# Patient Record
Sex: Male | Born: 1980 | Race: White | Hispanic: No | Marital: Single | Smoking: Current every day smoker
Health system: Southern US, Community
[De-identification: ages and names within clinical notes are randomized; demographics above are authoritative.]

---

## 1999-03-07 ENCOUNTER — Emergency Department (HOSPITAL_COMMUNITY): Admission: EM | Admit: 1999-03-07 | Discharge: 1999-03-07 | Payer: Self-pay | Admitting: *Deleted

## 2003-01-22 ENCOUNTER — Encounter: Payer: Self-pay | Admitting: Emergency Medicine

## 2003-01-22 ENCOUNTER — Emergency Department (HOSPITAL_COMMUNITY): Admission: EM | Admit: 2003-01-22 | Discharge: 2003-01-22 | Payer: Self-pay | Admitting: Emergency Medicine

## 2003-08-30 ENCOUNTER — Emergency Department (HOSPITAL_COMMUNITY): Admission: AD | Admit: 2003-08-30 | Discharge: 2003-08-30 | Payer: Self-pay | Admitting: Family Medicine

## 2003-12-21 ENCOUNTER — Emergency Department (HOSPITAL_COMMUNITY): Admission: EM | Admit: 2003-12-21 | Discharge: 2003-12-21 | Payer: Self-pay | Admitting: Emergency Medicine

## 2005-10-09 IMAGING — CT CT HEAD W/O CM
1 series · 16 of 28 positions shown, 20 images · non-contrast
Comparison: none

CLINICAL DATA: Syncope, left frontal soreness.  
 CT OF THE HEAD, WITHOUT CONTRAST ? 12/21/03
 No prior studies.
 TECHNIQUE 
 Contiguous axial CT images are obtained from the skull base to the vertex.  IV contrast was not requested.  
 Routine non-contrast head CT was performed.

[Series 2: brain · axial · 0.45mm/px · z∈[+118,+245]mm · 16 of 28 slices shown, 20 images]
[im 2/28  brain]
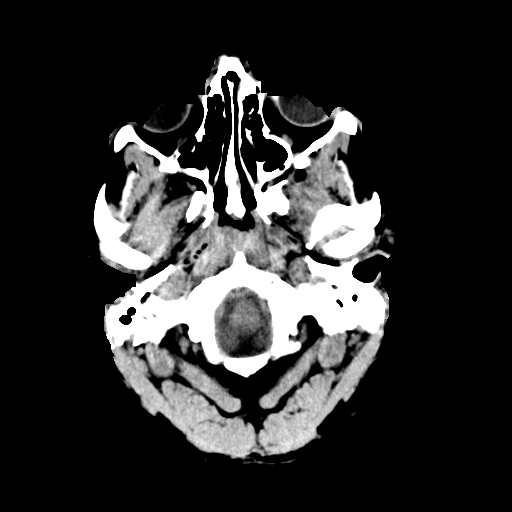
[im 2/28  bone]
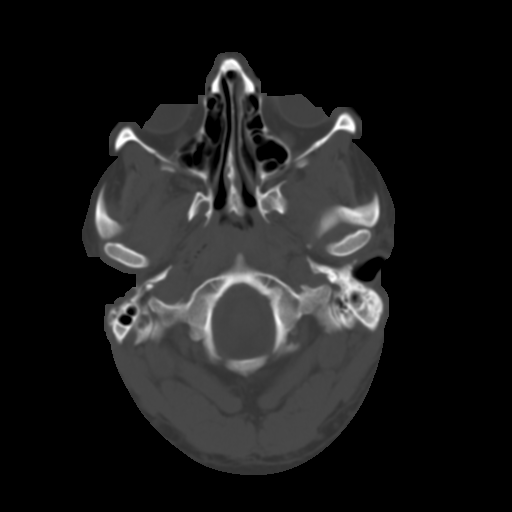
[im 4/28  brain]
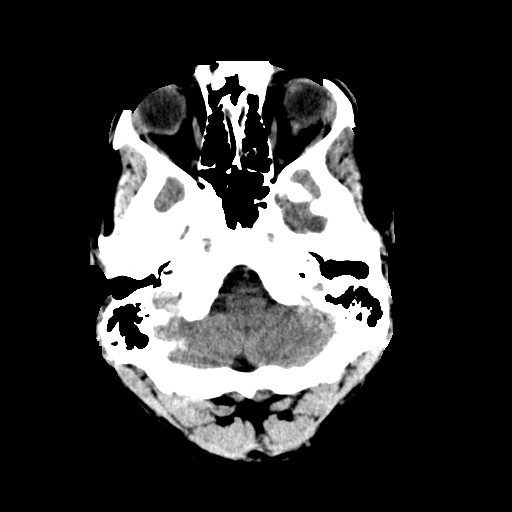
[im 6/28  brain]
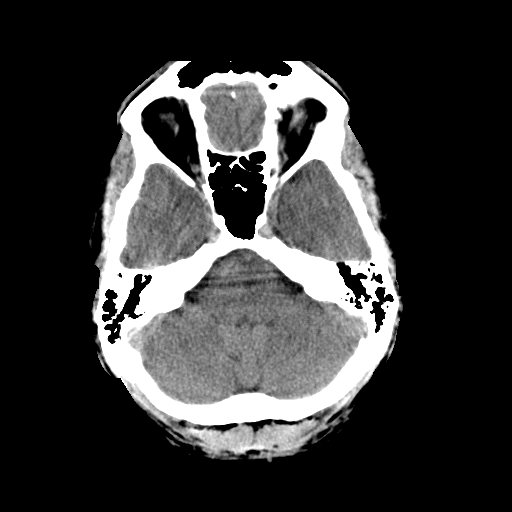
[im 7/28  brain]
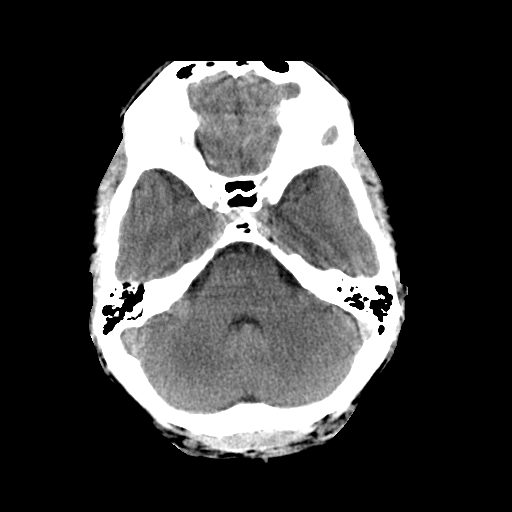
[im 9/28  brain]
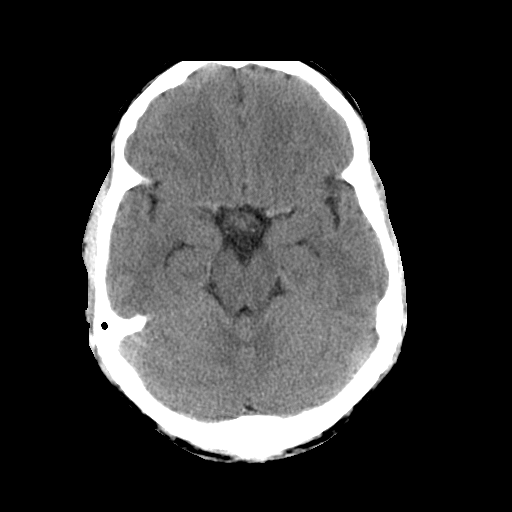
[im 9/28  bone]
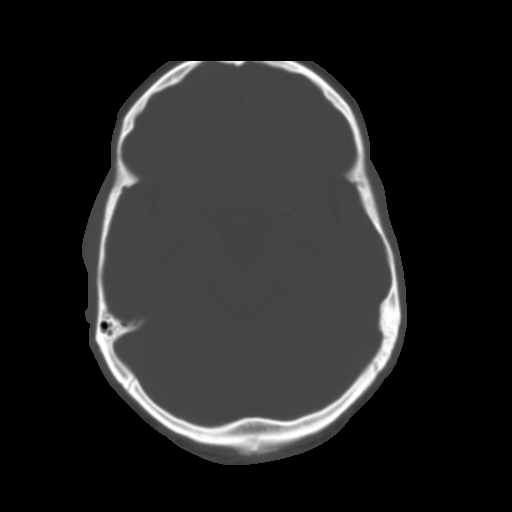
[im 10/28  brain]
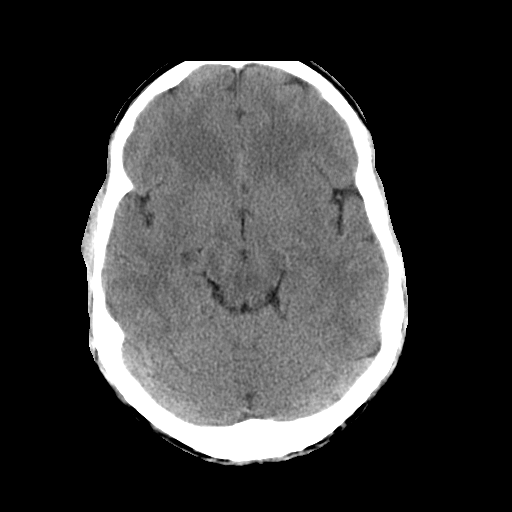
[im 12/28  brain]
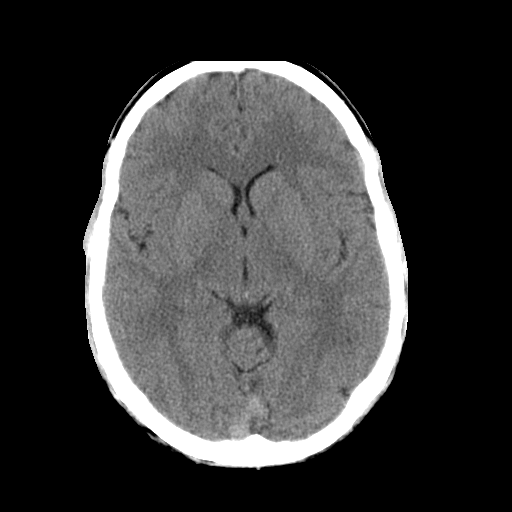
[im 14/28  brain]
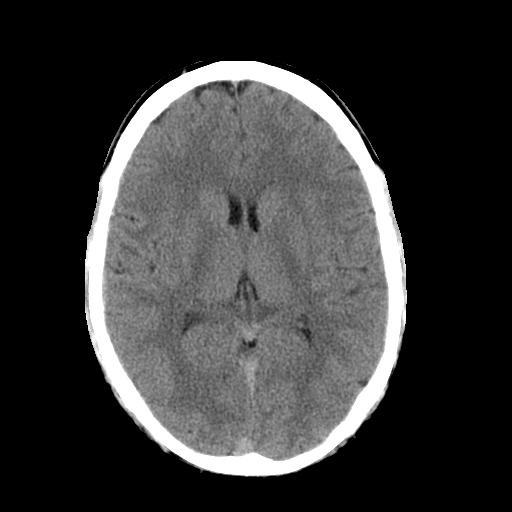
[im 15/28  brain]
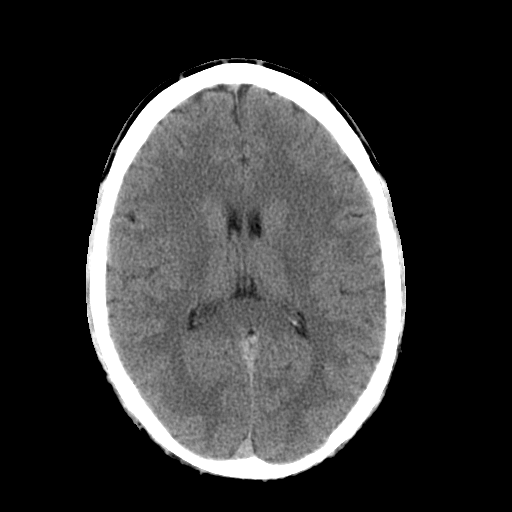
[im 15/28  bone]
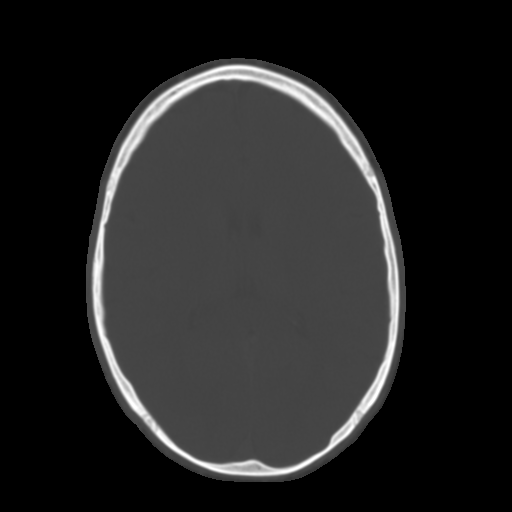
[im 17/28  brain]
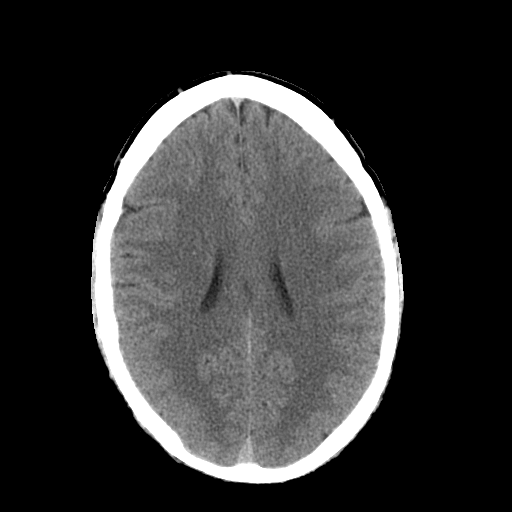
[im 19/28  brain]
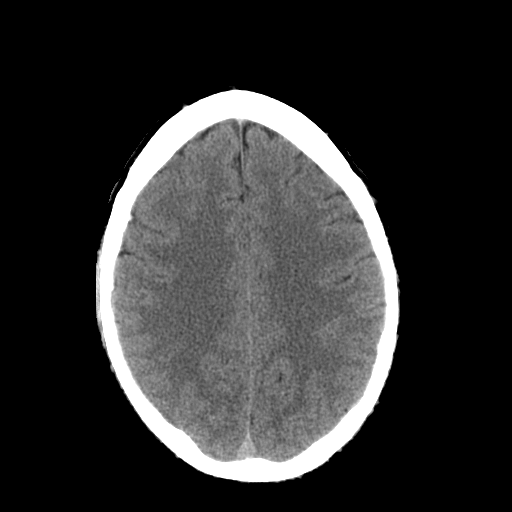
[im 20/28  brain]
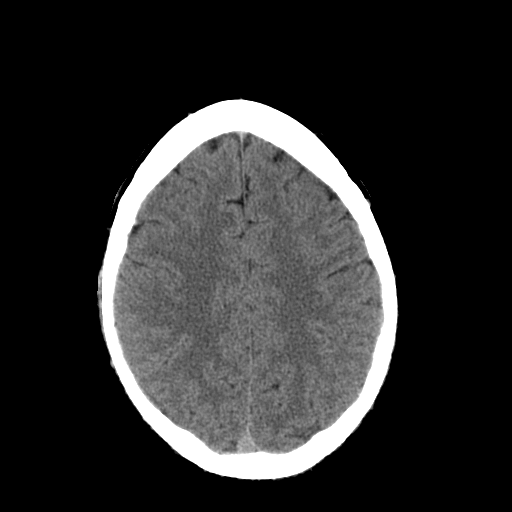
[im 22/28  brain]
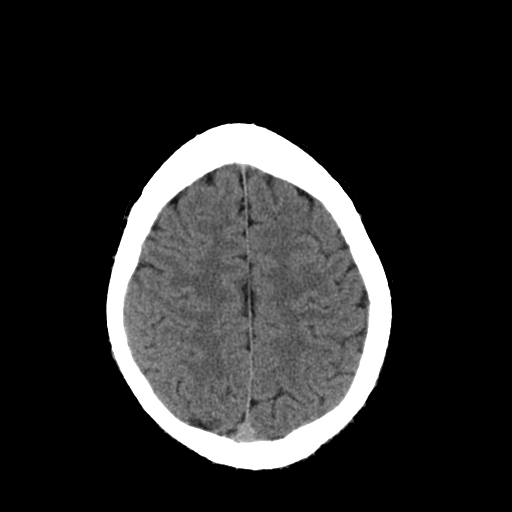
[im 22/28  bone]
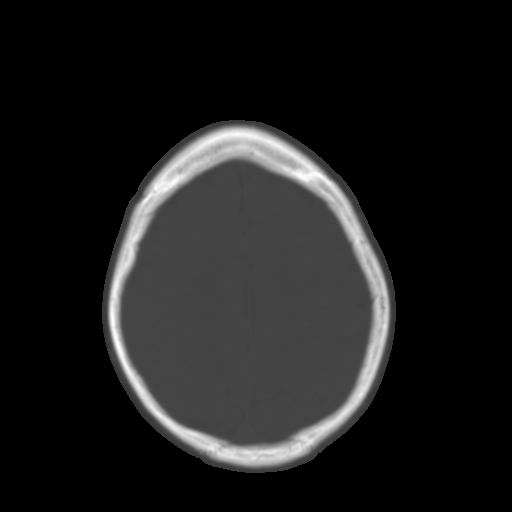
[im 23/28  brain]
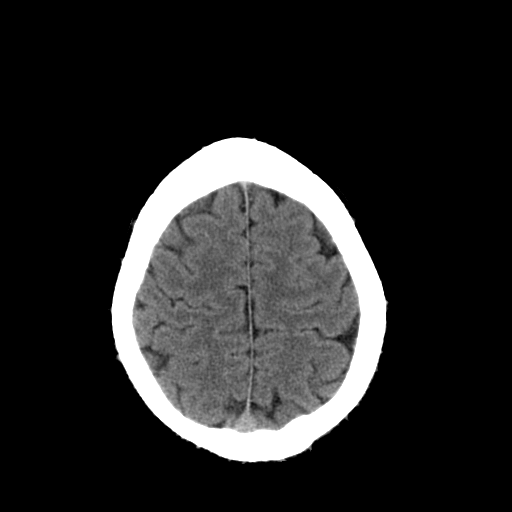
[im 25/28  brain]
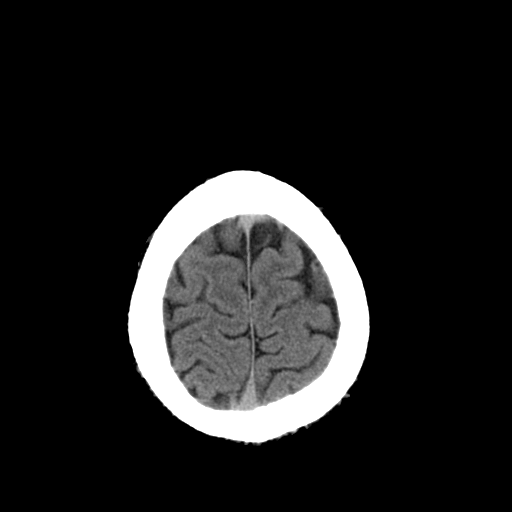
[im 27/28  brain]
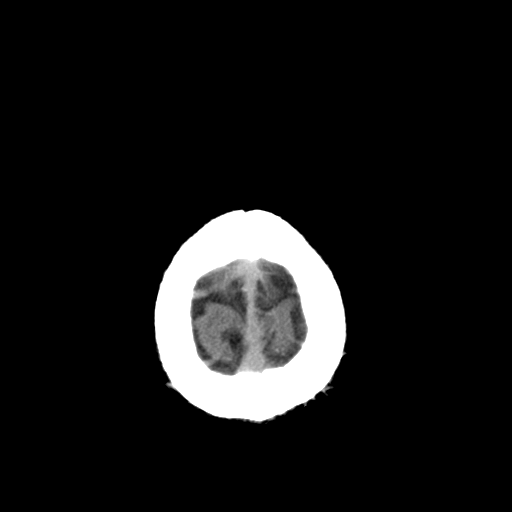

[16 of 28 positions shown; findings below may reference images not displayed]

On the lowest most image, there is a suggestion of an air-fluid level in the left maxillary sinus suggesting acute sinusitis.
 Intracranial structures appear normal.  There is no evidence of intracranial hemorrhage, brain edema, or mass effect. The ventricles are normal. No extra-axial abnormalities are identified. Bone windows show no significant abnormalities.

 IMPRESSION
 Probable left maxillary sinusitis.

## 2008-02-03 ENCOUNTER — Emergency Department (HOSPITAL_COMMUNITY): Admission: EM | Admit: 2008-02-03 | Discharge: 2008-02-03 | Payer: Self-pay | Admitting: Emergency Medicine

## 2012-07-29 ENCOUNTER — Emergency Department (INDEPENDENT_AMBULATORY_CARE_PROVIDER_SITE_OTHER): Payer: Self-pay

## 2012-07-29 ENCOUNTER — Encounter (HOSPITAL_COMMUNITY): Payer: Self-pay | Admitting: *Deleted

## 2012-07-29 ENCOUNTER — Emergency Department (INDEPENDENT_AMBULATORY_CARE_PROVIDER_SITE_OTHER)
Admission: EM | Admit: 2012-07-29 | Discharge: 2012-07-29 | Disposition: A | Discharge status: Home / Self Care | Payer: Self-pay | Source: Home / Self Care | Attending: Emergency Medicine | Admitting: Emergency Medicine

## 2012-07-29 DIAGNOSIS — Z23 Encounter for immunization: Secondary | ICD-10-CM

## 2012-07-29 DIAGNOSIS — L0390 Cellulitis, unspecified: Secondary | ICD-10-CM

## 2012-07-29 DIAGNOSIS — L0291 Cutaneous abscess, unspecified: Secondary | ICD-10-CM

## 2012-07-29 MED ORDER — CEFTRIAXONE SODIUM 1 G IJ SOLR
INTRAMUSCULAR | Status: AC
  Filled 2012-07-29: qty 10

## 2012-07-29 MED ORDER — CEPHALEXIN 500 MG PO CAPS: 500.0000 mg | ORAL_CAPSULE | Freq: Three times a day (TID) | ORAL | Status: DC

## 2012-07-29 MED ORDER — TETANUS-DIPHTH-ACELL PERTUSSIS 5-2.5-18.5 LF-MCG/0.5 IM SUSP
0.5000 mL | Freq: Once | INTRAMUSCULAR | Status: AC
  Administered 2012-07-29: 0.5 mL via INTRAMUSCULAR

## 2012-07-29 MED ORDER — HYDROCODONE-ACETAMINOPHEN 5-325 MG PO TABS
ORAL_TABLET | ORAL | Status: DC
Start: 2012-07-29 — End: 2018-08-04

## 2012-07-29 MED ORDER — TETANUS-DIPHTH-ACELL PERTUSSIS 5-2.5-18.5 LF-MCG/0.5 IM SUSP
INTRAMUSCULAR | Status: AC
  Filled 2012-07-29: qty 0.5

## 2012-07-29 MED ORDER — CEFTRIAXONE SODIUM 1 G IJ SOLR
1.0000 g | Freq: Once | INTRAMUSCULAR | Status: AC
  Administered 2012-07-29: 1 g via INTRAMUSCULAR

## 2012-07-29 MED ORDER — LIDOCAINE HCL (PF) 1 % IJ SOLN
INTRAMUSCULAR | Status: AC
  Filled 2012-07-29: qty 5
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
## 2012-07-29 NOTE — Discharge Instructions (Signed)
Cellulitis Cellulitis is an infection of the skin and the tissue beneath it. The infected area is usually red and tender. Cellulitis occurs most often in the arms and lower legs.   CAUSES   Cellulitis is caused by bacteria that enter the skin through cracks or cuts in the skin. The most common types of bacteria that cause cellulitis are Staphylococcus and Streptococcus. SYMPTOMS    Redness and warmth.   Swelling.   Tenderness or pain.   Fever.  DIAGNOSIS  Your caregiver can usually determine what is wrong based on a physical exam. Blood tests may also be done. TREATMENT   Treatment usually involves taking an antibiotic medicine. HOME CARE INSTRUCTIONS    Take your antibiotics as directed. Finish them even if you start to feel better.   Keep the infected arm or leg elevated to reduce swelling.   Apply a warm cloth to the affected area up to 4 times per day to relieve pain.   Only take over-the-counter or prescription medicines for pain, discomfort, or fever as directed by your caregiver.   Keep all follow-up appointments as directed by your caregiver.  SEEK MEDICAL CARE IF:    You notice red streaks coming from the infected area.   Your red area gets larger or turns dark in color.   Your bone or joint underneath the infected area becomes painful after the skin has healed.   Your infection returns in the same area or another area.   You notice a swollen bump in the infected area.   You develop new symptoms.  SEEK IMMEDIATE MEDICAL CARE IF:    You have a fever.   You feel very sleepy.   You develop vomiting or diarrhea.   You have a general ill feeling (malaise) with muscle aches and pains.  MAKE SURE YOU:    Understand these instructions.   Will watch your condition.   Will get help right away if you are not doing well or get worse.  Document Released: 02/21/2005 Document Revised: 11/13/2011 Document Reviewed: 07/30/2011 Exit[employer] Patient Information 2013  [employer].    

## 2012-07-29 NOTE — ED Notes (Signed)
Pt reports that last week he accidentally cut self in left thumb - blade missing tip when he pulled out - now reports that it is tender to touch, difficult to bend and painful

## 2014-06-25 ENCOUNTER — Encounter (HOSPITAL_COMMUNITY): Payer: Self-pay

## 2014-06-25 ENCOUNTER — Emergency Department (INDEPENDENT_AMBULATORY_CARE_PROVIDER_SITE_OTHER)
Admission: EM | Admit: 2014-06-25 | Discharge: 2014-06-25 | Disposition: A | Discharge status: Home / Self Care | Payer: Self-pay | Source: Home / Self Care | Attending: Emergency Medicine | Admitting: Emergency Medicine

## 2014-06-25 DIAGNOSIS — L03114 Cellulitis of left upper limb: Secondary | ICD-10-CM

## 2014-06-25 MED ORDER — HYDROCODONE-ACETAMINOPHEN 5-325 MG PO TABS: ORAL_TABLET | ORAL | Status: DC

## 2014-06-25 MED ORDER — MUPIROCIN 2 % EX OINT
1.0000 | TOPICAL_OINTMENT | Freq: Three times a day (TID) | CUTANEOUS | Status: DC
Start: 2014-06-25 — End: 2018-08-04

## 2014-06-25 MED ORDER — SULFAMETHOXAZOLE-TRIMETHOPRIM 800-160 MG PO TABS: 1.0000 | ORAL_TABLET | Freq: Two times a day (BID) | ORAL | Status: DC

## 2014-06-25 MED ORDER — CEPHALEXIN 500 MG PO CAPS: 500.0000 mg | ORAL_CAPSULE | Freq: Three times a day (TID) | ORAL | Status: DC

## 2014-06-25 NOTE — Discharge Instructions (Signed)
Cellulitis Cellulitis is an infection of the skin and the tissue beneath it. The infected area is usually red and tender. Cellulitis occurs most often in the arms and lower legs.  CAUSES  Cellulitis is caused by bacteria that enter the skin through cracks or cuts in the skin. The most common types of bacteria that cause cellulitis are staphylococci and streptococci. SIGNS AND SYMPTOMS   Redness and warmth.  Swelling.  Tenderness or pain.  Fever. DIAGNOSIS  Your health care provider can usually determine what is wrong based on a physical exam. Blood tests may also be done. TREATMENT  Treatment usually involves taking an antibiotic medicine. HOME CARE INSTRUCTIONS   Take your antibiotic medicine as directed by your health care provider. Finish the antibiotic even if you start to feel better.  Keep the infected arm or leg elevated to reduce swelling.  Apply a warm cloth to the affected area up to 4 times per day to relieve pain.  Take medicines only as directed by your health care provider.  Keep all follow-up visits as directed by your health care provider. SEEK MEDICAL CARE IF:   You notice red streaks coming from the infected area.  Your red area gets larger or turns dark in color.  Your bone or joint underneath the infected area becomes painful after the skin has healed.  Your infection returns in the same area or another area.  You notice a swollen bump in the infected area.  You develop new symptoms.  You have a fever. SEEK IMMEDIATE MEDICAL CARE IF:   You feel very sleepy.  You develop vomiting or diarrhea.  You have a general ill feeling (malaise) with muscle aches and pains. MAKE SURE YOU:   Understand these instructions.  Will watch your condition.  Will get help right away if you are not doing well or get worse. Document Released: 02/21/2005 Document Revised: 09/28/2013 Document Reviewed: 07/30/2011 Exit[employer] Patient Information 2015 [employer].  This information is not intended to replace advice given to you by your health care provider. Make sure you discuss any questions you have with your health care provider. Spider Bite Spider bites are not common. Most spider bites do not cause serious problems. The elderly, very young children, and people with certain existing medical conditions are more likely to experience significant symptoms. SYMPTOMS  Spider bites may not cause any pain at first. Within 1 or 2 days of the bite, there may be swelling, redness, and pain in the bite area. However, some spider bites can cause pain within the first hour. TREATMENT  Your caregiver may prescribe antibiotic medicine if a bacterial infection develops in the bite. However, not all spider bites require antibiotics or prescription medicines.  HOME CARE INSTRUCTIONS  Do not scratch the bite area.  Keep the bite area clean and dry. Wash the area with soap and water as directed.  Put ice or cool compresses on the bite area.  Put ice in a plastic bag.  Place a towel between your skin and the bag.  Leave the ice on for 20 minutes, 4 times a day for the first 2 to 3 days, or as directed.  Keep the bite area elevated above the level of your heart. This helps reduce redness and swelling.  Only take over-the-counter or prescription medicines as directed by your caregiver.  If you are given antibiotics, take them as directed. Finish them even if you start to feel better. You may need a tetanus shot if:    You cannot remember when you had your last tetanus shot.  You have never had a tetanus shot.  The injury broke your skin. If you get a tetanus shot, your arm may swell, get red, and feel warm to the touch. This is common and not a problem. If you need a tetanus shot and you choose not to have one, there is a rare chance of getting tetanus. Sickness from tetanus can be serious. SEEK MEDICAL CARE IF: Your bite is not better after 3 days of  treatment. SEEK IMMEDIATE MEDICAL CARE IF:  Your bite turns purple or develops increased swelling, pain, or redness.  You develop shortness of breath or chest pain.  You have muscle cramps or painful muscle spasms.  You develop abdominal pain, nausea, or vomiting.  You feel unusually tired or sleepy. MAKE SURE YOU:  Understand these instructions.  Will watch your condition.  Will get help right away if you are not doing well or get worse. Document Released: 06/21/2004 Document Revised: 08/06/2011 Document Reviewed: 12/13/2010 Exit[employer] Patient Information 2015 [employer]. This information is not intended to replace advice given to you by your health care provider. Make sure you discuss any questions you have with your health care provider.  

## 2014-06-25 NOTE — ED Provider Notes (Signed)
   Chief Complaint    Skin Problem   History of Present Illness      Edwin Landry is a 34 year old male who was awakened at 3 AM this morning by a sudden, sharp pain in his left upper arm. He did not see anything biting him. Thereafter the area became swollen, tender, red, and blistered up. He's not had any purulent drainage. He has had a low-grade fever to 99.4 and he denies any other skin lesions.  Review of Systems   Other than as noted above, the patient denies any of the following symptoms: Systemic:  No fever or chills. ENT:  No nasal congestion, rhinorrhea, sore throat, swelling of lips, tongue or throat. Resp:  No cough, wheezing, or shortness of breath.  PMFSH    Past medical history, family history, social history, meds, and allergies were reviewed.   Physical Exam     Vital signs:  BP 122/84 mmHg  Pulse 90  Temp(Src) 99.4 F (37.4 C) (Oral)  Resp 18  SpO2 96% Gen:  Alert, oriented, in no distress. ENT:  Pharynx clear, no intraoral lesions, moist mucous membranes. Lungs:  Clear to auscultation. Skin:  There is a 6 cm area of erythema and induration on the left upper arm with a central area of blistering. There is no fluctuance or purulent drainage.     Course in Urgent Care Center     The area was prepped with alcohol, the 2 or 3 largest blisters were popped with a #11 scalpel blade, and the blister fluid was cultured. A sterile dressing was applied.   Assessment    The encounter diagnosis was Cellulitis of left upper extremity.  I think this is either a spider bite or a MRSA infection. It may be secondarily infected with bullous impetigo.   Plan     1.  Meds:  The following meds were prescribed:   Discharge Medication List as of 06/25/2014  7:46 PM    START taking these medications   Details  !! cephALEXin (KEFLEX) 500 MG capsule Take 1 capsule (500 mg total) by mouth 3 (three) times daily., Starting 06/25/2014, Until Discontinued, Normal    !!  HYDROcodone-acetaminophen (NORCO/VICODIN) 5-325 MG per tablet 1 to 2 tabs every 4 to 6 hours as needed for pain., Print    mupirocin ointment (BACTROBAN) 2 % Apply 1 application topically 3 (three) times daily., Starting 06/25/2014, Until Discontinued, Normal    sulfamethoxazole-trimethoprim (BACTRIM DS,SEPTRA DS) 800-160 MG per tablet Take 1 tablet by mouth 2 (two) times daily., Starting 06/25/2014, Until Discontinued, Normal     !! - Potential duplicate medications found. Please discuss with provider.      2.  Patient Education/Counseling:  The patient was given appropriate handouts, self care instructions, and instructed in symptomatic relief.  He was given strict return precautions if the area of erythema should get worse or if he has higher fever or increasing pain to return right away.   3.  Follow up:  The patient was told to follow up here if no better in 3 to 4 days, or sooner if becoming worse in any way, and given some red flag symptoms such as worsening rash, fever, or difficulty breathing which would prompt immediate return.  Follow up here if necessary.      Reuben Likesavid C Viyaan Champine, MD 06/25/14 2039

## 2014-06-25 NOTE — ED Notes (Addendum)
C/o numbness in right arm when he woke up , much like if slept wrong, did not go away as day progressed. Burning and itching left upper arm , deltoid area. Developed blisters after daughter put OTC cortisone ointment on spot. No other area affected. No one else in home affected. Did not notice spiders. 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 Used Goodie Powder w minimal relief of pain. Area circled by family member to see if area spreading. Feels hot to touch. Temp 99.4

## 2014-06-28 LAB — CULTURE, ROUTINE-ABSCESS
CULTURE: NO GROWTH
Special Requests: NORMAL

## 2018-08-04 ENCOUNTER — Ambulatory Visit (HOSPITAL_COMMUNITY)
Admission: EM | Admit: 2018-08-04 | Discharge: 2018-08-04 | Disposition: A | Discharge status: Home / Self Care | Payer: Self-pay | Attending: Family Medicine | Admitting: Family Medicine

## 2018-08-04 ENCOUNTER — Encounter (HOSPITAL_COMMUNITY): Payer: Self-pay | Admitting: Emergency Medicine

## 2018-08-04 DIAGNOSIS — H9201 Otalgia, right ear: Secondary | ICD-10-CM

## 2018-08-04 MED ORDER — AMOXICILLIN 500 MG PO CAPS: 1000.0000 mg | ORAL_CAPSULE | Freq: Two times a day (BID) | ORAL | 0 refills | Status: AC

## 2018-08-04 MED ORDER — FLUTICASONE PROPIONATE 50 MCG/ACT NA SUSP: 1.0000 | Freq: Every day | NASAL | 2 refills | Status: AC

## 2018-08-04 MED ORDER — CETIRIZINE HCL 10 MG PO TABS: 10.0000 mg | ORAL_TABLET | Freq: Every day | ORAL | 0 refills | Status: AC

## 2018-08-04 NOTE — Discharge Instructions (Addendum)
We will treat you for possible dental infection versus eustachian tube dysfunction. Amoxicillin twice a day for dental infection Flonase and Zyrtec daily Follow up as needed for continued or worsening symptoms

## 2018-08-04 NOTE — ED Triage Notes (Signed)
Pt here for right ear pain 

## 2018-08-04 NOTE — ED Provider Notes (Signed)
[facility]    CSN: [id number] Arrival date & time: 08/04/18  1015     History   Chief Complaint Chief Complaint  Patient presents with  . Otalgia    HPI Edwin Landry is a 38 y.o. male.   Patient is a 38 year old male that presents with approximate 2 weeks of waxing and waning right ear discomfort.  Describes as fullness and ear with popping and cracking.  Sharp pain at times.  He used earwax removal thinking that it was a cerumen impaction.  This did not help his symptoms.  He is also having issues with a bad tooth to the right upper mouth.  This tooth is known to form abscesses. Denies any specific dental pain at this time.   He has not seen a dentist about this problem.  Denies any fevers, chills, night sweats, nasal congestion, rhinorrhea, cough.   ROS per HPI      History reviewed. No pertinent past medical history.  There are no active problems to display for this patient.   History reviewed. No pertinent surgical history.     Home Medications    Prior to Admission medications   Medication Sig Start Date End Date Taking? Authorizing Provider  amoxicillin (AMOXIL) 500 MG capsule Take 2 capsules (1,000 mg total) by mouth 2 (two) times daily. 08/04/18   Dahlia Byes A, NP  cetirizine (ZYRTEC) 10 MG tablet Take 1 tablet (10 mg total) by mouth daily. 08/04/18   Teron Blais, Gloris Manchester A, NP  fluticasone (FLONASE) 50 MCG/ACT nasal spray Place 1 spray into both nostrils daily. 08/04/18   Janace Aris, NP    Family History Family History  Family history unknown: Yes    Social History Social History   Tobacco Use  . Smoking status: Current Every Day Smoker    Packs/day: 1.00    Types: Cigarettes  Substance Use Topics  . Alcohol use: No  . Drug use: No     Allergies   Patient has no known allergies.   Review of Systems Review of Systems   Physical Exam Triage Vital Signs ED Triage Vitals [08/04/18 1104]  Enc Vitals Group     BP (!) 156/83   Pulse Rate 80     Resp 18     Temp (!) 97.5 F (36.4 C)     Temp Source Temporal     SpO2 98 %     Weight      Height      Head Circumference      Peak Flow      Pain Score 5     Pain Loc      Pain Edu?      Excl. in GC?    No data found.  Updated Vital Signs BP (!) 156/83 (BP Location: Right Arm)   Pulse 80   Temp (!) 97.5 F (36.4 C) (Temporal)   Resp 18   SpO2 98%   Visual Acuity Right Eye Distance:   Left Eye Distance:   Bilateral Distance:    Right Eye Near:   Left Eye Near:    Bilateral Near:     Physical Exam Vitals signs and nursing note reviewed.  Constitutional:      Appearance: He is well-developed.  HENT:     Head: Normocephalic and atraumatic.     Right Ear: Tympanic membrane and ear canal normal.     Left Ear: Tympanic membrane and ear canal normal.     Nose: Nose  normal.     Mouth/Throat:     Dentition: Gingival swelling and dental caries present.     Pharynx: Oropharynx is clear.  Eyes:     Conjunctiva/sclera: Conjunctivae normal.  Neck:     Musculoskeletal: Normal range of motion and neck supple.  Pulmonary:     Effort: Pulmonary effort is normal.  Musculoskeletal: Normal range of motion.  Lymphadenopathy:     Cervical: No cervical adenopathy.  Skin:    General: Skin is warm and dry.  Neurological:     Mental Status: He is alert.      UC Treatments / Results  Labs (all labs ordered are listed, but only abnormal results are displayed) Labs Reviewed - No data to display  EKG None  Radiology No results found.  Procedures Procedures (including critical care time)  Medications Ordered in UC Medications - No data to display  Initial Impression / Assessment and Plan / UC Course  I have reviewed the triage vital signs and the nursing notes.  Pertinent labs & imaging results that were available during my care of the patient were reviewed by me and considered in my medical decision making (see chart for details).     No  signs of ear infection Most likely the pain is coming from the dental area We will go ahead and treat for possibility of dental infection vs sinus infection  with amoxicillin He is also had some nasal congestion and rhinorrhea.  We will treat this with Flonase and Zyrtec This could also be causing his ear discomfort and fullness. Follow up as needed for continued or worsening symptoms  Final Clinical Impressions(s) / UC Diagnoses   Final diagnoses:  Otalgia of right ear     Discharge Instructions     We will treat you for possible dental infection versus eustachian tube dysfunction. Amoxicillin twice a day for dental infection Flonase and Zyrtec daily Follow up as needed for continued or worsening symptoms     ED Prescriptions    Medication Sig Dispense Auth. Provider   amoxicillin (AMOXIL) 500 MG capsule Take 2 capsules (1,000 mg total) by mouth 2 (two) times daily. 40 capsule Kyri Dai A, NP   fluticasone (FLONASE) 50 MCG/ACT nasal spray Place 1 spray into both nostrils daily. 16 g Izack Hoogland A, NP   cetirizine (ZYRTEC) 10 MG tablet Take 1 tablet (10 mg total) by mouth daily. 30 tablet Janace Aris, NP     Controlled Substance Prescriptions [place] Controlled Substance Registry consulted? no   Janace Aris, NP 08/06/18 1041

## 2024-01-13 ENCOUNTER — Telehealth (HOSPITAL_COMMUNITY): Payer: Self-pay | Admitting: Licensed Clinical Social Worker

## 2024-01-13 NOTE — Telephone Encounter (Signed)
 The therapist returns Fielding's call confirming his identity via two identifiers. He says that he got out of jail a couple of days ago but the charges were not substance-related. He says that he started out using pain pills a couple years ago saying that he has snorted heroin and used crack and the last couple of years he has gone back to smoking crack with the last use being 16 days ago. He has no prior treatment history. He says that he has [facility] Medicaid. He says that he does smoke pot once a day as it helps with the nerves. He does say that he is willing to do whatever it takes; however, he says that he is not wanting to get on medication to substitute one thing for another. After the therapist talks to him about baclofen for reducing cocaine cravings, he says that he might have an interest in this.  After the therapist explains the continuum of care, Jeremih says that he would like to start with individual sessions and attend NA meetings having no prior treatment history. The therapist explains how to find meetings online via [email or web address].   The therapist informs him that he will relay this information to Ms. Darice Simpler, LMFT, LCAS as she is the provider who does individual therapy at [facility] for addiction-related issues.   Zell Maier, MA, LCSW, [facility], LCAS 01/13/2024

## 2024-01-14 ENCOUNTER — Telehealth (HOSPITAL_COMMUNITY): Payer: Self-pay

## 2024-01-14 NOTE — Telephone Encounter (Signed)
 This therapist calls [facility] as he told Zell Maier, LCSW, [facility], LCAS that he wanted individual therapy.  A message comes on and says the call cannot be completed at this time. Try your call later. Therapist tries the call again and receives the same message. Therapist will call at another time.   Darice Simpler, MS, LMFT, LCAS
